# Patient Record
Sex: Female | Born: 1940 | Race: White | Hispanic: No | Marital: Married | Smoking: Never smoker
Health system: Southern US, Community
[De-identification: ages and names within clinical notes are randomized; demographics above are authoritative.]

## PROBLEM LIST (undated history)

## (undated) DIAGNOSIS — E7800 Pure hypercholesterolemia, unspecified: Secondary | ICD-10-CM

## (undated) DIAGNOSIS — E119 Type 2 diabetes mellitus without complications: Secondary | ICD-10-CM

## (undated) DIAGNOSIS — J449 Chronic obstructive pulmonary disease, unspecified: Secondary | ICD-10-CM

## (undated) DIAGNOSIS — I4891 Unspecified atrial fibrillation: Secondary | ICD-10-CM

## (undated) DIAGNOSIS — N289 Disorder of kidney and ureter, unspecified: Secondary | ICD-10-CM

## (undated) DIAGNOSIS — M1990 Unspecified osteoarthritis, unspecified site: Secondary | ICD-10-CM

## (undated) HISTORY — PX: CHOLECYSTECTOMY: SHX55

## (undated) HISTORY — PX: NEPHRECTOMY: SHX65

## (undated) HISTORY — PX: ABDOMINAL HYSTERECTOMY: SHX81

## (undated) HISTORY — PX: OTHER SURGICAL HISTORY: SHX169

---

## 2006-11-04 ENCOUNTER — Emergency Department (HOSPITAL_COMMUNITY): Admission: EM | Admit: 2006-11-04 | Discharge: 2006-11-04 | Payer: Self-pay | Admitting: Emergency Medicine

## 2007-02-24 ENCOUNTER — Observation Stay (HOSPITAL_COMMUNITY): Admission: EM | Admit: 2007-02-24 | Discharge: 2007-02-25 | Payer: Self-pay | Admitting: Emergency Medicine

## 2007-03-06 ENCOUNTER — Encounter (HOSPITAL_COMMUNITY): Admission: RE | Admit: 2007-03-06 | Discharge: 2007-05-15 | Payer: Self-pay | Admitting: Internal Medicine

## 2007-06-24 ENCOUNTER — Emergency Department (HOSPITAL_COMMUNITY): Admission: EM | Admit: 2007-06-24 | Discharge: 2007-06-24 | Payer: Self-pay | Admitting: Emergency Medicine

## 2008-10-23 IMAGING — CR DG CHEST 2V
2 series · 2 of 2 positions shown · non-contrast
Comparison: None.

CLINICAL DATA: Shortness of breath, fever, chest pain.
 CHEST - 2 VIEW - 11/04/06:

[w chest pa]
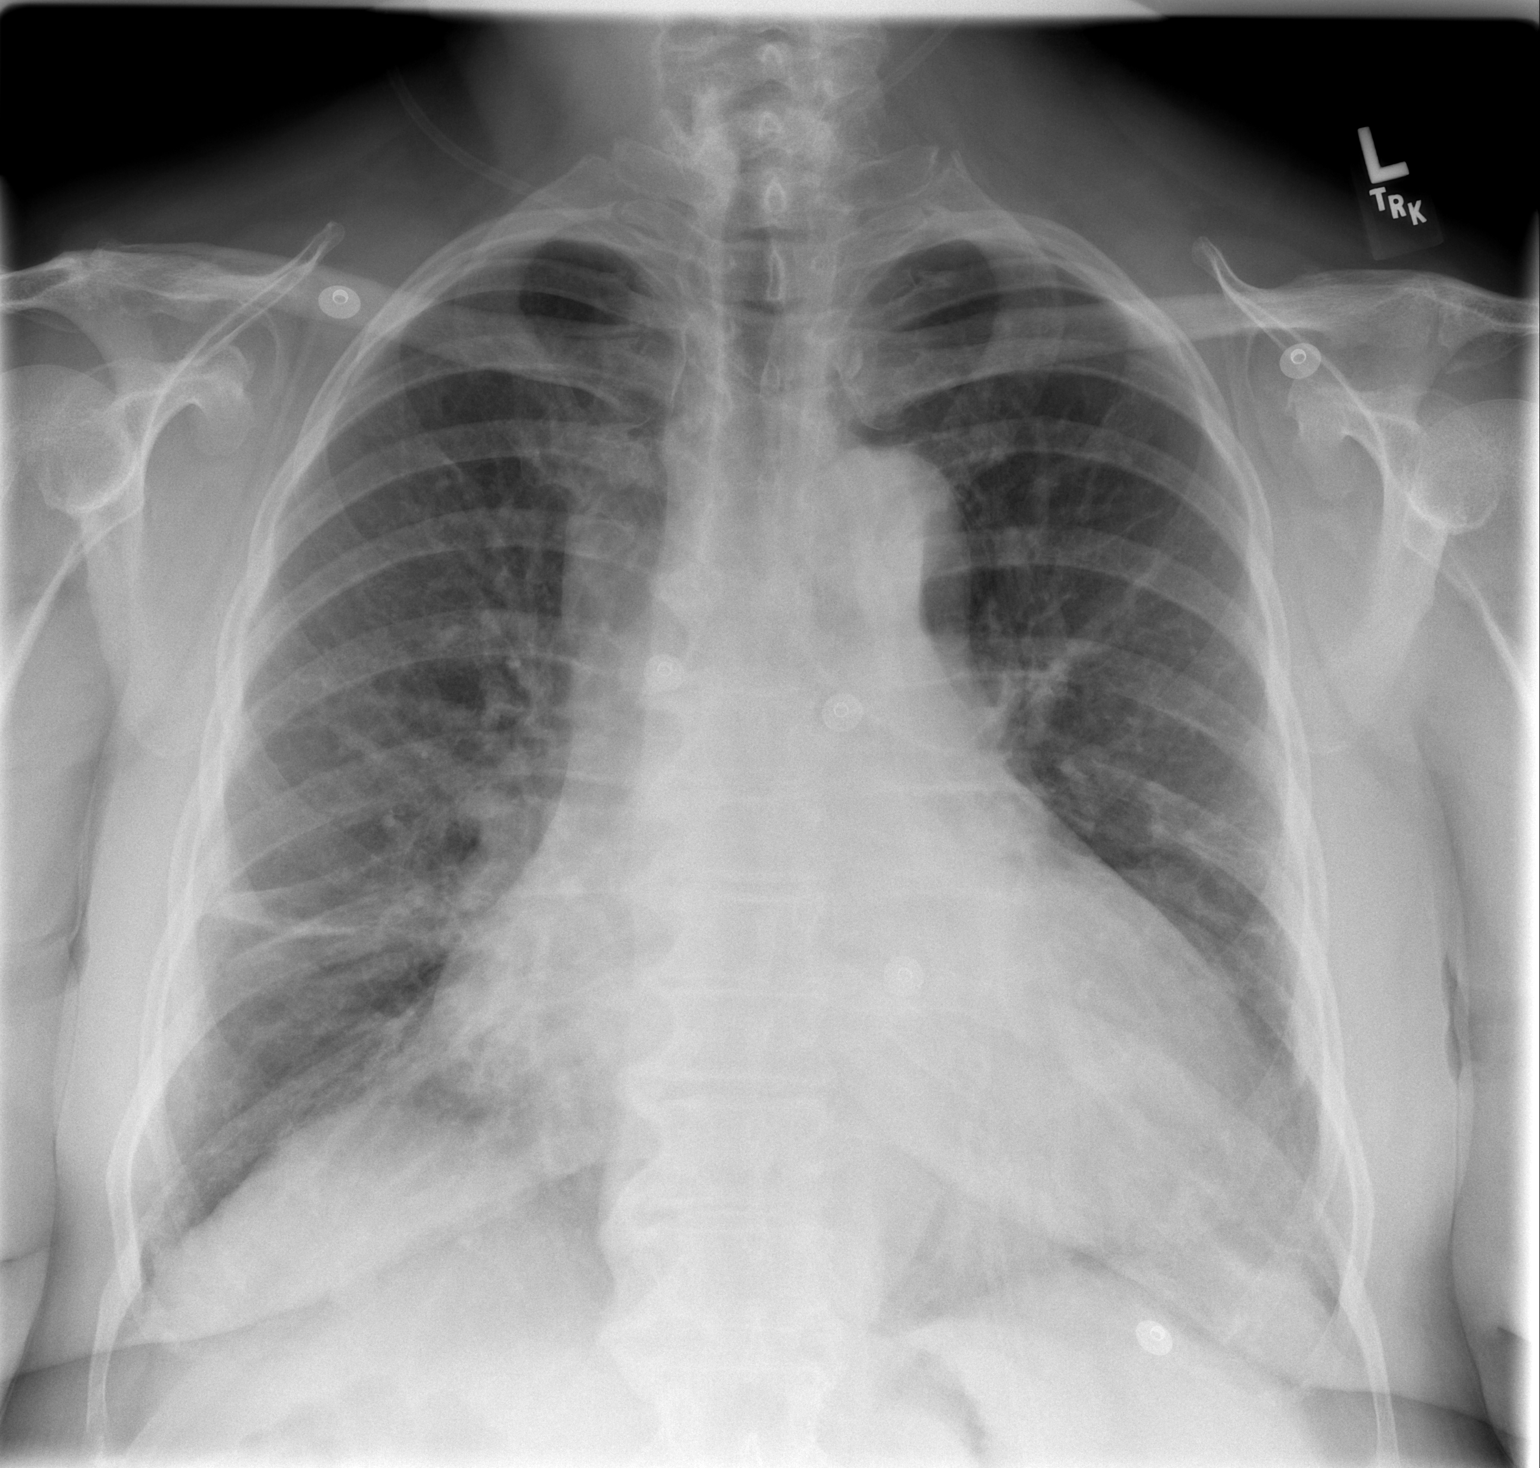

[w chest lat]
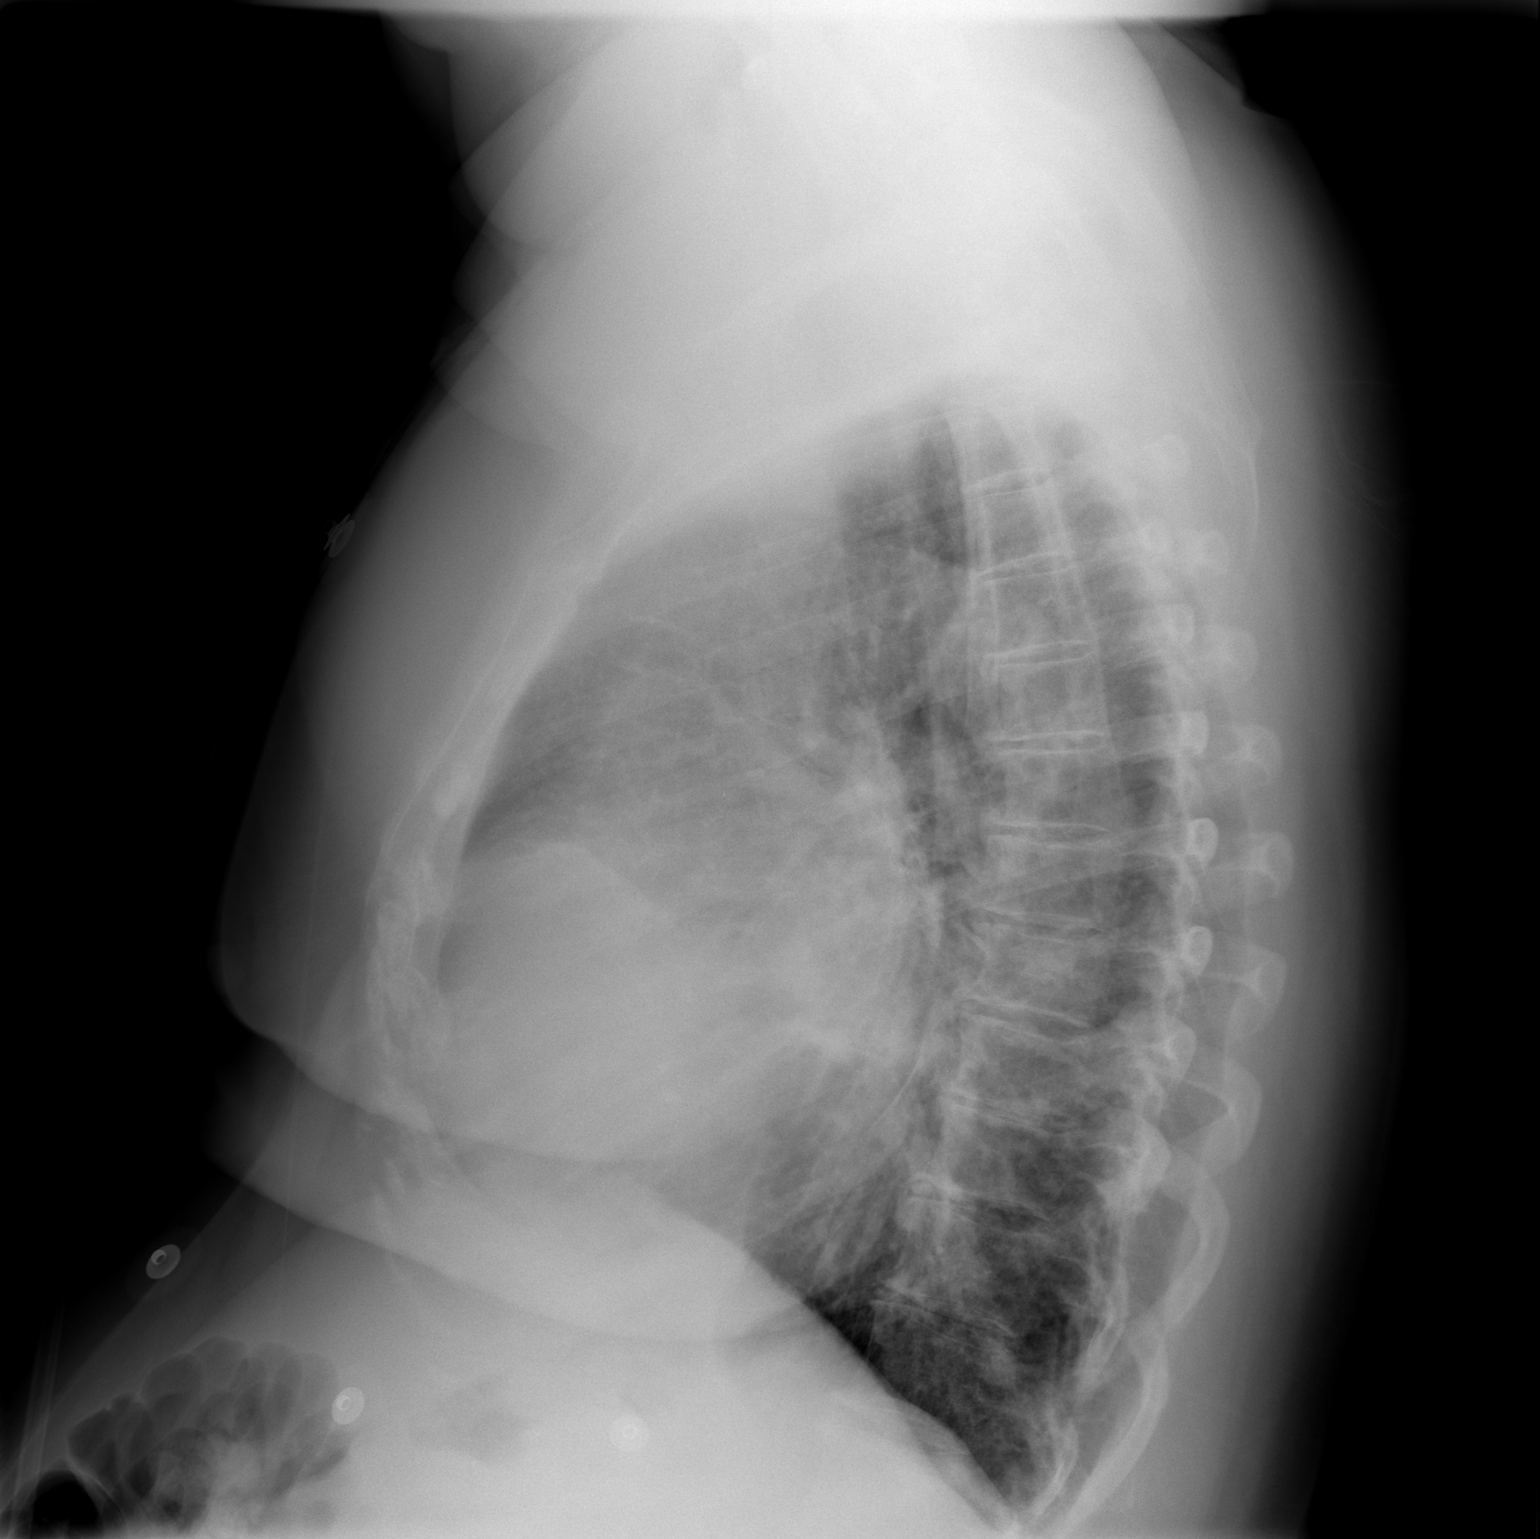

[2 of 2 positions shown; findings below may reference images not displayed]

FINDINGS: The trachea is midline.   The heart size is enlarged.  Scattered subsegmental atelectasis.  No pleural fluid.
IMPRESSION: Scattered subsegmental atelectasis.

## 2008-12-07 ENCOUNTER — Encounter: Payer: Self-pay | Admitting: Cardiology

## 2008-12-07 ENCOUNTER — Ambulatory Visit: Payer: Self-pay | Admitting: Cardiology

## 2008-12-07 ENCOUNTER — Inpatient Hospital Stay (HOSPITAL_COMMUNITY): Admission: EM | Admit: 2008-12-07 | Discharge: 2008-12-10 | Payer: Self-pay | Admitting: Emergency Medicine

## 2009-06-12 IMAGING — CR DG SACRUM/COCCYX 2+V
3 series · 3 of 3 positions shown · non-contrast
Comparison: 02/24/2007

CLINICAL DATA: Fall with sacral pain

SACRUM AND COCCYX - 2+ VIEW

[t sacrum a.p. *]
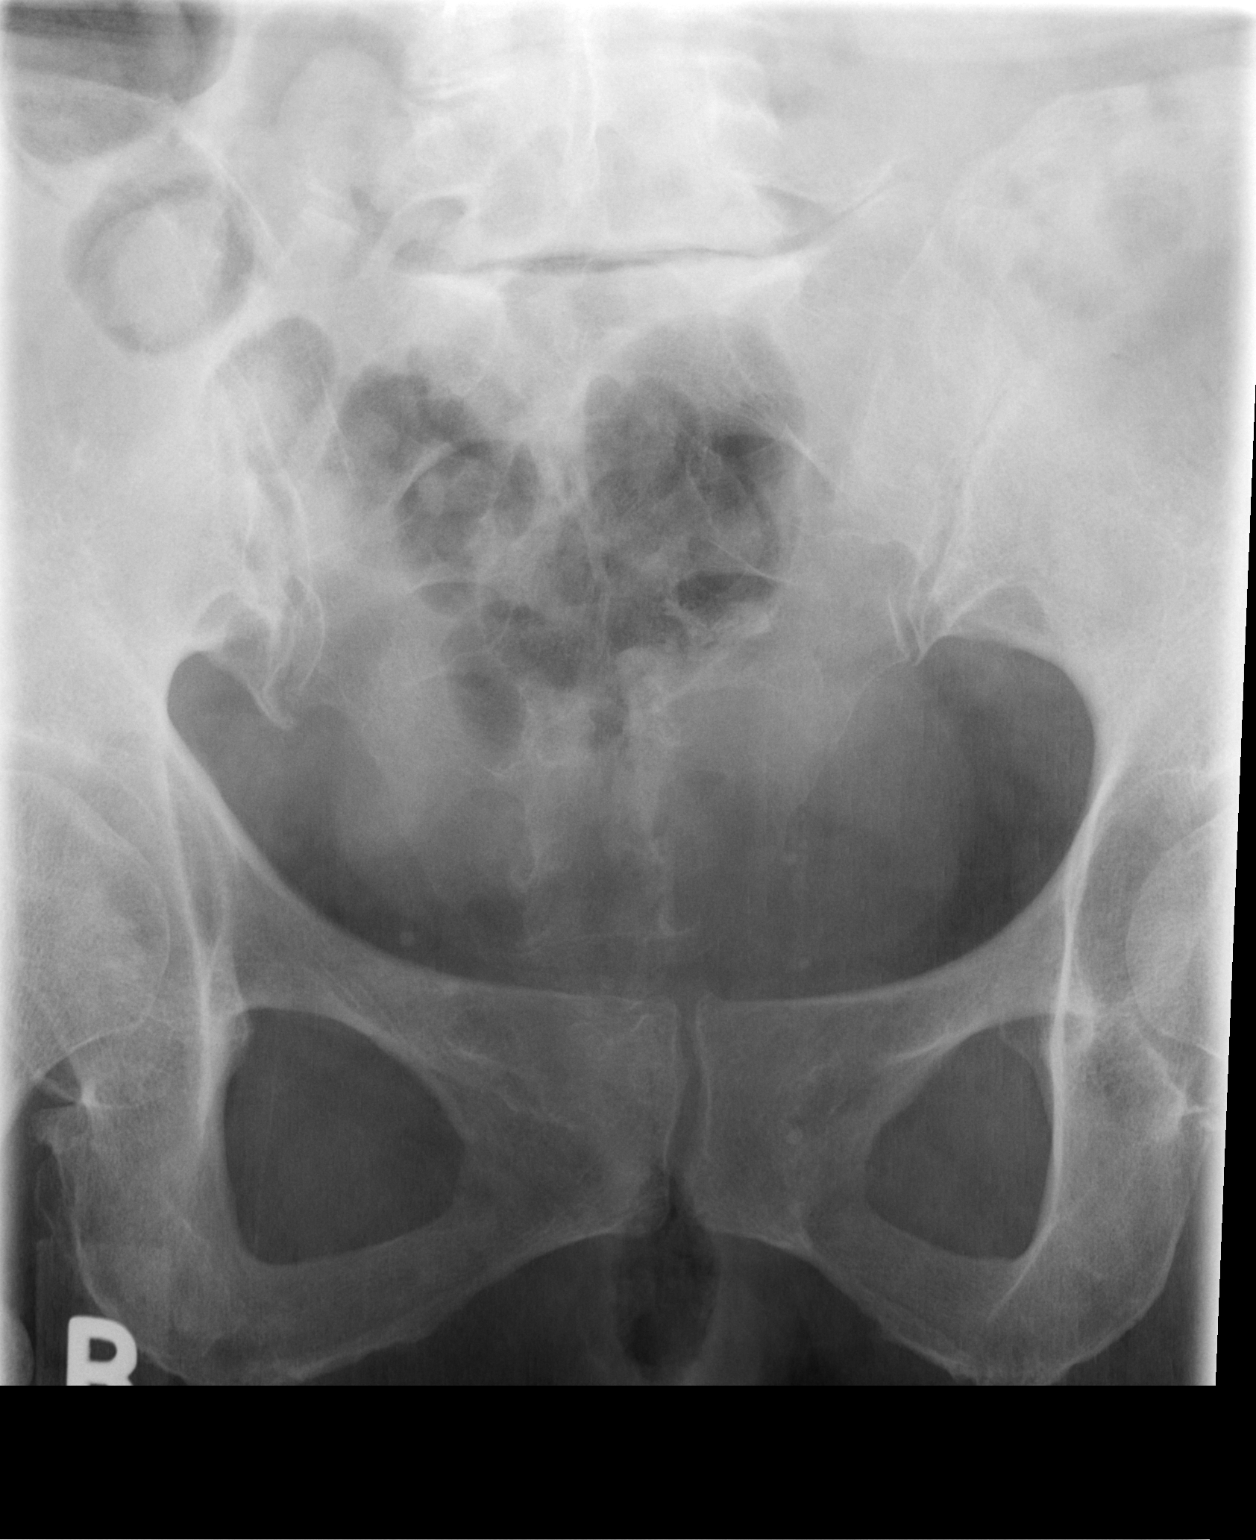

[t coccyx a.p. *]
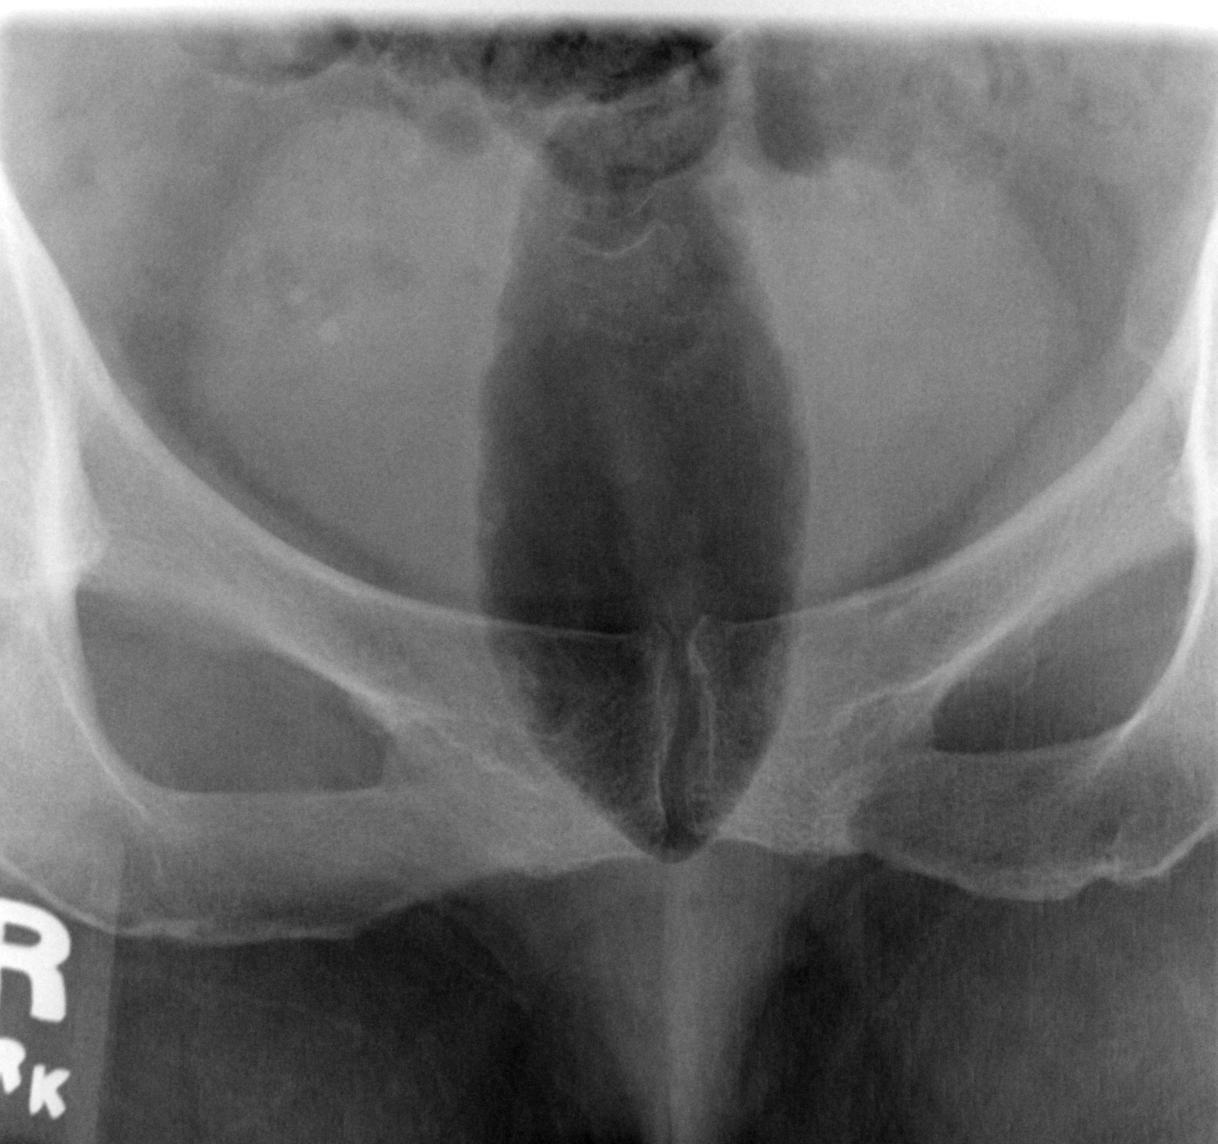

[t sacrum lat *]
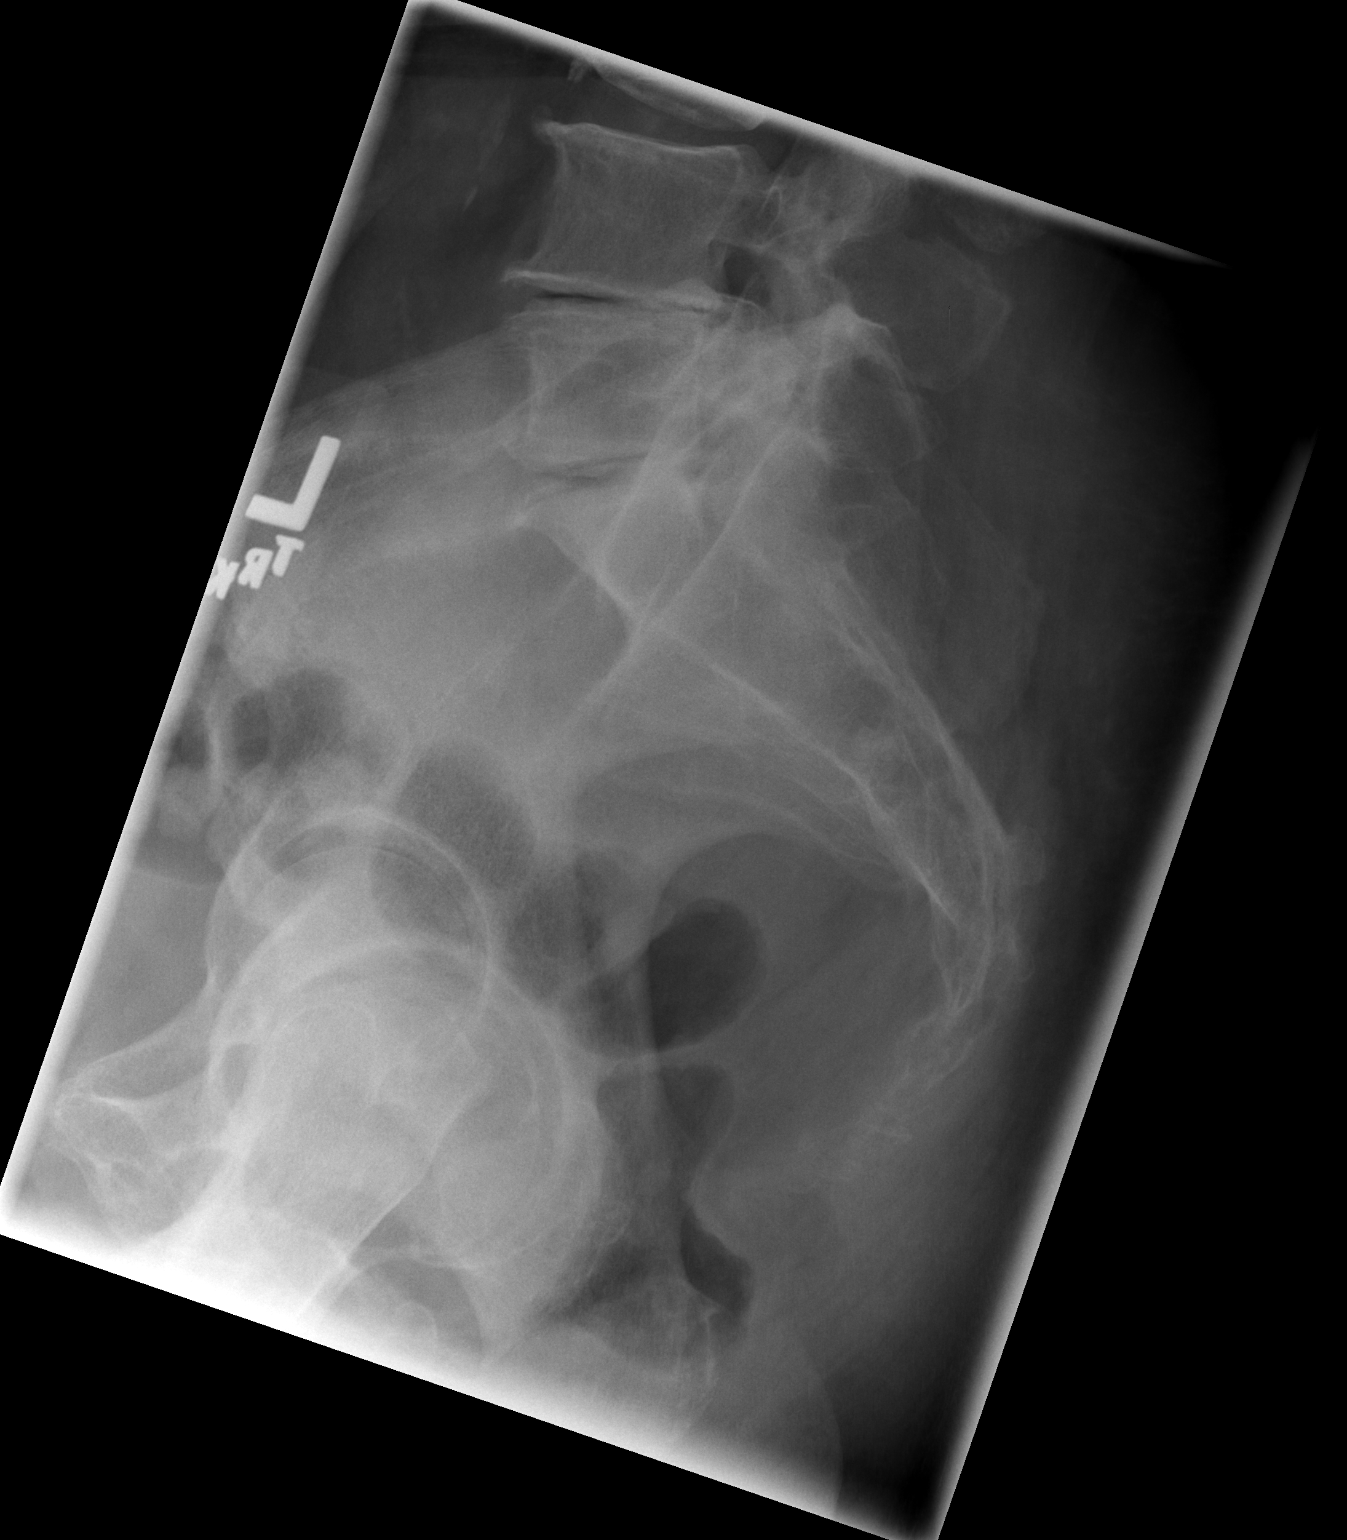

[3 of 3 positions shown; findings below may reference images not displayed]

FINDINGS: Portions of the sacrum are obscured by overlying bowel
gas and colonic contents.  No definite disruption of the arcuate
lines noted.  There is bony demineralization with poor delineation
of the cortex at the sacrococcygeal junction.  This could be a sign
of fracture.  Consider CT scan or MRI for confirmation.
IMPRESSION: 1.  Mild bony irregularity at the lumbosacral junction on the
lateral projection, possibly a manifestation of nondisplaced
fracture.  Consider CT or MRI for confirmation.

## 2009-09-04 ENCOUNTER — Emergency Department (HOSPITAL_COMMUNITY): Admission: EM | Admit: 2009-09-04 | Discharge: 2009-09-04 | Payer: Self-pay | Admitting: Family Medicine

## 2009-10-04 ENCOUNTER — Ambulatory Visit (HOSPITAL_COMMUNITY): Admission: RE | Admit: 2009-10-04 | Discharge: 2009-10-04 | Payer: Self-pay | Admitting: Urology

## 2010-05-25 LAB — PROTIME-INR
INR: 1.35 (ref 0.00–1.49)
Prothrombin Time: 16.6 seconds — ABNORMAL HIGH (ref 11.6–15.2)

## 2010-05-25 LAB — BASIC METABOLIC PANEL
BUN: 10 mg/dL (ref 6–23)
BUN: 12 mg/dL (ref 6–23)
BUN: 12 mg/dL (ref 6–23)
CO2: 26 mEq/L (ref 19–32)
CO2: 31 mEq/L (ref 19–32)
Calcium: 8.1 mg/dL — ABNORMAL LOW (ref 8.4–10.5)
Calcium: 8.2 mg/dL — ABNORMAL LOW (ref 8.4–10.5)
Calcium: 8.6 mg/dL (ref 8.4–10.5)
Chloride: 101 mEq/L (ref 96–112)
Chloride: 102 mEq/L (ref 96–112)
Chloride: 105 mEq/L (ref 96–112)
Chloride: 97 mEq/L (ref 96–112)
Creatinine, Ser: 1.4 mg/dL — ABNORMAL HIGH (ref 0.4–1.2)
Creatinine, Ser: 1.47 mg/dL — ABNORMAL HIGH (ref 0.4–1.2)
GFR calc Af Amer: 40 mL/min — ABNORMAL LOW (ref >60)
GFR calc Af Amer: 42 mL/min — ABNORMAL LOW (ref >60)
GFR calc Af Amer: 44 mL/min — ABNORMAL LOW (ref >60)
GFR calc non Af Amer: 33 mL/min — ABNORMAL LOW (ref >60)
GFR calc non Af Amer: 36 mL/min — ABNORMAL LOW (ref >60)
Glucose, Bld: 130 mg/dL — ABNORMAL HIGH (ref 70–99)
Potassium: 3 mEq/L — ABNORMAL LOW (ref 3.5–5.1)
Potassium: 3.4 mEq/L — ABNORMAL LOW (ref 3.5–5.1)
Potassium: 3.9 mEq/L (ref 3.5–5.1)
Sodium: 139 mEq/L (ref 135–145)
Sodium: 139 mEq/L (ref 135–145)
Sodium: 141 mEq/L (ref 135–145)

## 2010-05-25 LAB — HEPATIC FUNCTION PANEL
AST: 20 U/L (ref 0–37)
Alkaline Phosphatase: 47 U/L (ref 39–117)
Bilirubin, Direct: 0.2 mg/dL (ref 0.0–0.3)
Indirect Bilirubin: 0.6 mg/dL (ref 0.3–0.9)

## 2010-05-25 LAB — GLUCOSE, CAPILLARY
Glucose-Capillary: 119 mg/dL — ABNORMAL HIGH (ref 70–99)
Glucose-Capillary: 127 mg/dL — ABNORMAL HIGH (ref 70–99)
Glucose-Capillary: 131 mg/dL — ABNORMAL HIGH (ref 70–99)
Glucose-Capillary: 148 mg/dL — ABNORMAL HIGH (ref 70–99)
Glucose-Capillary: 157 mg/dL — ABNORMAL HIGH (ref 70–99)

## 2010-05-25 LAB — HEPARIN LEVEL (UNFRACTIONATED)
Heparin Unfractionated: 0.12 IU/mL — ABNORMAL LOW (ref 0.30–0.70)
Heparin Unfractionated: 0.36 IU/mL (ref 0.30–0.70)

## 2010-05-25 LAB — CBC
HCT: 29.9 % — ABNORMAL LOW (ref 36.0–46.0)
HCT: 30.1 % — ABNORMAL LOW (ref 36.0–46.0)
Hemoglobin: 10.2 g/dL — ABNORMAL LOW (ref 12.0–15.0)
Hemoglobin: 11 g/dL — ABNORMAL LOW (ref 12.0–15.0)
MCHC: 34 g/dL (ref 30.0–36.0)
MCHC: 34.2 g/dL (ref 30.0–36.0)
MCV: 89.3 fL (ref 78.0–100.0)
MCV: 90.1 fL (ref 78.0–100.0)
Platelets: 168 10*3/uL (ref 150–400)
RBC: 3.34 MIL/uL — ABNORMAL LOW (ref 3.87–5.11)
RBC: 3.65 MIL/uL — ABNORMAL LOW (ref 3.87–5.11)
RDW: 15.9 % — ABNORMAL HIGH (ref 11.5–15.5)
WBC: 7.5 10*3/uL (ref 4.0–10.5)
WBC: 8.4 10*3/uL (ref 4.0–10.5)

## 2010-05-25 LAB — CARDIAC PANEL(CRET KIN+CKTOT+MB+TROPI)
CK, MB: 0.9 ng/mL (ref 0.3–4.0)
CK, MB: 1.4 ng/mL (ref 0.3–4.0)
Total CK: 81 U/L (ref 7–177)

## 2010-05-25 LAB — DIFFERENTIAL
Basophils Relative: 0 % (ref 0–1)
Eosinophils Absolute: 0 10*3/uL (ref 0.0–0.7)
Eosinophils Relative: 0 % (ref 0–5)
Neutro Abs: 4.5 10*3/uL (ref 1.7–7.7)
Neutrophils Relative %: 73 % (ref 43–77)

## 2010-05-25 LAB — CK TOTAL AND CKMB (NOT AT ARMC)
Relative Index: INVALID (ref 0.0–2.5)
Total CK: 79 U/L (ref 7–177)

## 2010-05-25 LAB — LIPID PANEL
HDL: 43 mg/dL (ref >39)
LDL Cholesterol: 67 mg/dL (ref 0–99)
Total CHOL/HDL Ratio: 2.9 RATIO
Triglycerides: 67 mg/dL (ref <150)
VLDL: 13 mg/dL (ref 0–40)

## 2010-05-25 LAB — BRAIN NATRIURETIC PEPTIDE: Pro B Natriuretic peptide (BNP): 67 pg/mL (ref 0.0–100.0)

## 2010-05-25 LAB — POCT CARDIAC MARKERS: CKMB, poc: <1 ng/mL — ABNORMAL LOW (ref 1.0–8.0)

## 2010-07-04 NOTE — H&P (Signed)
Wanda Harrison, Wanda Harrison                 ACCOUNT NO.:  [id number]   MEDICAL RECORD NO.:  [id number]          PATIENT TYPE:  INP   LOCATION:  1830                         FACILITY:  [facility]   PHYSICIAN:  Hollice Espy, M.D.DATE OF BIRTH:  1940/05/23   DATE OF ADMISSION:  02/24/2007  DATE OF DISCHARGE:                              HISTORY & PHYSICAL   PRIMARY CARE PHYSICIAN:  Deirdre Peer. Polite, M.D.   CHIEF COMPLAINT:  Nausea, vomiting, abdominal pain, and mild shortness  of breath.   HISTORY OF PRESENT ILLNESS:  The patient is a 70 year old white female  with past medical history of CAD, CHF, diabetes mellitus, COPD who for  the past several days is having progressive worsening nausea, vomiting,  and abdominal pain, generalized to the point where she is unable to keep  any food or drink down.  It got to the point where she kept trying to  take her medicines and kept throwing them back up.  When she came in,  she could not __________  .  She  also started to feel short of breath.  When she came to the emergency room, she had an abdominal x-ray that was  unremarkable.  She was noted just to have some mild CHF at best.  Labs  were done on the patient.  She was found to have a normal white count  with no shift.  Her BNP was only minimally elevated at 276.  She had  normal cardiac markers.  Normal BMET level.  Given her continued  inability to  be able to keep very little down, it was felt best that  she come in for further treatment and evaluation.  Apparently she has  done well. She complains of some mild nausea.  She denies any headaches,  vision changes.  No dysphagia, no chest pain or palpitations.  She  complains of some shortness of breath but no wheezing or coughing.  She  complains of some generalized abdominal pain but no hematuria, dysuria.  No diarrhea but she does have constipation.  No focal extremity  numbness, weakness or pain other than chronic lower extremity numbness.  Review of systems otherwise negative.   PAST MEDICAL HISTORY:  1. Diabetes.  2. COPD.  3. Hypertension.  4. CAD.  5. CHF.  6. Depression.   MEDICATIONS:  She is on accolade, Actos, Advair, allopurinol, aspirin,  Atrovent, Lexapro, Detrol, diltiazem, glipizide, iron, Januvia, Lasix,  lisinopril, metformin, K-Dur, Protonix, simvastatin, temazepam,  theophylline, and Ambien.   ALLERGIES:  She reports allergies to CAPOTEN and PROCARDIA which she is  on.  HCTZ.   SOCIAL HISTORY:  She denies any tobacco or alcohol or drug use.   FAMILY HISTORY:  Noncontributory.   PHYSICAL EXAMINATION:  VITAL SIGNS:  On admission, temperature 98.2,  heart rate 78, blood pressure 150/92, now down to 133/72, respirations  20, oxygen saturation 92% on 3 L.  GENERAL:  The patient is alert and oriented, in no apparent distress.  HEENT:  Normocephalic, atraumatic.  Mucous membranes are dry.  No  carotid bruits.  HEART:  Currently regular  rate and rhythm.  2/6 systolic ejection  murmur.  LUNGS:  Decreased breath sounds throughout.  She has moderate airway  exchange.  ABDOMEN:  Soft, obese, minimal tenderness with hypoactive bowel sounds.  Hypoactive bowel sounds.  EXTREMITIES:  No clubbing or cyanosis, but a 1+ pitting edema. She has  signs of chronic sensory loss from diabetes mellitus.   Her EKG shows atrial fibrillation with incomplete right bundle branch  block.  Compared to an EKG from November 04, 2006, she has atrial  fibrillation.  It is unclear whether this is new or old.  Currently, she  is in a normal sinus rhythm so this may be paroxysmal atrial  fibrillation.  There are no previous records that document this.   LABORATORY DATA:  Chest x-ray is as per HPI.  White count 6.5,  hemoglobin 11.4, hematocrit 34, MCV 83, platelet count 255, no shift.  Sodium 140, potassium 3.6, chloride 99, bicarb 31, BUN 7, creatinine  0.09, glucose 72. LFTs unremarkable.  Lipase level 17.  CPK 71.5, MB  less  than 1, troponin-I less than 0.05.  BNP 276.   ASSESSMENT/PLAN:  1. Nausea and vomiting, abdominal pain.  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  Will hold her Januvia and Lantus and treat with IV      Reglan.  2. Shortness of breath, mild congestive heart failure secondary to      poor absorption of medications, nausea and vomiting.  Will try the      Lasix.  Continue other medications.  3. Hypertension and coronary artery disease.  Continue lisinopril,      aspirin and other medications.  4. Chronic obstructive pulmonary disease.  Continue oxygen and      breathing treatments.  5. Diabetes mellitus.  Continue glipizide and sliding scale only.  6. Atrial fibrillation, unclear if this is new or old.  Will notify      primary care physician.  In the meantime, cover with Lovenox.      Hollice Espy, M.D.  Electronically Signed     SKK/MEDQ  D:  02/25/2007  T:  02/25/2007  Job:  [id number]   cc:   Deirdre Peer. Polite, M.D.

## 2010-11-10 LAB — POCT CARDIAC MARKERS
CKMB, poc: <1 ng/mL — ABNORMAL LOW (ref 1.0–8.0)
Myoglobin, poc: 75.4 ng/mL (ref 12–200)
Troponin i, poc: <0.05 ng/mL (ref 0.00–0.05)

## 2010-11-10 LAB — COMPREHENSIVE METABOLIC PANEL
Albumin: 3.6 g/dL (ref 3.5–5.2)
Alkaline Phosphatase: 71 U/L (ref 39–117)
BUN: 7 mg/dL (ref 6–23)
CO2: 31 mEq/L (ref 19–32)
Chloride: 99 mEq/L (ref 96–112)
GFR calc non Af Amer: >60 mL/min (ref >60)
Glucose, Bld: 82 mg/dL (ref 70–99)
Potassium: 3.6 mEq/L (ref 3.5–5.1)
Total Bilirubin: 0.7 mg/dL (ref 0.3–1.2)

## 2010-11-10 LAB — CBC
HCT: 34.5 % — ABNORMAL LOW (ref 36.0–46.0)
Hemoglobin: 11.4 g/dL — ABNORMAL LOW (ref 12.0–15.0)
MCHC: 33 g/dL (ref 30.0–36.0)
MCV: 82.3 fL (ref 78.0–100.0)
Platelets: 255 10*3/uL (ref 150–400)
RBC: 4.19 MIL/uL (ref 3.87–5.11)
RDW: 16.4 % — ABNORMAL HIGH (ref 11.5–15.5)
WBC: 6.5 10*3/uL (ref 4.0–10.5)

## 2010-11-10 LAB — DIFFERENTIAL
Basophils Absolute: 0 10*3/uL (ref 0.0–0.1)
Basophils Relative: 0 % (ref 0–1)
Monocytes Absolute: 0.8 10*3/uL (ref 0.1–1.0)
Neutro Abs: 4 10*3/uL (ref 1.7–7.7)

## 2010-11-10 LAB — HEMOGLOBIN A1C: Mean Plasma Glucose: 136 mg/dL

## 2010-11-10 LAB — LIPASE, BLOOD: Lipase: 17 U/L (ref 11–59)

## 2010-11-10 LAB — B-NATRIURETIC PEPTIDE (CONVERTED LAB): Pro B Natriuretic peptide (BNP): 276 pg/mL — ABNORMAL HIGH (ref 0.0–100.0)

## 2010-11-30 LAB — DIFFERENTIAL
Basophils Absolute: 0
Basophils Relative: 0
Neutro Abs: 8.6 — ABNORMAL HIGH
Neutrophils Relative %: 78 — ABNORMAL HIGH

## 2010-11-30 LAB — CBC
MCHC: 33.1
RBC: 4.82
RDW: 19.9 — ABNORMAL HIGH

## 2010-11-30 LAB — I-STAT 8, (EC8 V) (CONVERTED LAB)
Chloride: 101
Glucose, Bld: 94
Potassium: 3.7
pH, Ven: 7.454 — ABNORMAL HIGH

## 2010-11-30 LAB — POCT CARDIAC MARKERS
Myoglobin, poc: 127
Operator id: 146091

## 2012-03-31 ENCOUNTER — Encounter (INDEPENDENT_AMBULATORY_CARE_PROVIDER_SITE_OTHER): Payer: Medicare Other | Admitting: Ophthalmology

## 2012-03-31 DIAGNOSIS — H3530 Unspecified macular degeneration: Secondary | ICD-10-CM

## 2012-03-31 DIAGNOSIS — E1139 Type 2 diabetes mellitus with other diabetic ophthalmic complication: Secondary | ICD-10-CM

## 2012-03-31 DIAGNOSIS — H35059 Retinal neovascularization, unspecified, unspecified eye: Secondary | ICD-10-CM

## 2012-03-31 DIAGNOSIS — I10 Essential (primary) hypertension: Secondary | ICD-10-CM

## 2012-03-31 DIAGNOSIS — H43819 Vitreous degeneration, unspecified eye: Secondary | ICD-10-CM

## 2012-03-31 DIAGNOSIS — E11319 Type 2 diabetes mellitus with unspecified diabetic retinopathy without macular edema: Secondary | ICD-10-CM

## 2012-03-31 DIAGNOSIS — H35039 Hypertensive retinopathy, unspecified eye: Secondary | ICD-10-CM

## 2012-04-08 ENCOUNTER — Encounter (INDEPENDENT_AMBULATORY_CARE_PROVIDER_SITE_OTHER): Payer: Self-pay | Admitting: Ophthalmology

## 2012-04-28 ENCOUNTER — Ambulatory Visit (INDEPENDENT_AMBULATORY_CARE_PROVIDER_SITE_OTHER): Payer: Medicare Other | Admitting: Ophthalmology

## 2012-04-28 DIAGNOSIS — H35059 Retinal neovascularization, unspecified, unspecified eye: Secondary | ICD-10-CM

## 2012-06-10 ENCOUNTER — Encounter (INDEPENDENT_AMBULATORY_CARE_PROVIDER_SITE_OTHER): Payer: PRIVATE HEALTH INSURANCE | Admitting: Ophthalmology

## 2012-06-10 DIAGNOSIS — H35059 Retinal neovascularization, unspecified, unspecified eye: Secondary | ICD-10-CM

## 2012-09-09 ENCOUNTER — Encounter (INDEPENDENT_AMBULATORY_CARE_PROVIDER_SITE_OTHER): Payer: PRIVATE HEALTH INSURANCE | Admitting: Ophthalmology

## 2014-10-18 ENCOUNTER — Emergency Department (HOSPITAL_COMMUNITY)
Admission: EM | Admit: 2014-10-18 | Discharge: 2014-10-19 | Disposition: A | Discharge status: Home / Self Care | Payer: Medicare Other | Attending: Emergency Medicine | Admitting: Emergency Medicine

## 2014-10-18 ENCOUNTER — Encounter (HOSPITAL_COMMUNITY): Payer: Self-pay

## 2014-10-18 ENCOUNTER — Emergency Department (HOSPITAL_COMMUNITY): Payer: Medicare Other

## 2014-10-18 DIAGNOSIS — I4891 Unspecified atrial fibrillation: Secondary | ICD-10-CM | POA: Insufficient documentation

## 2014-10-18 DIAGNOSIS — J039 Acute tonsillitis, unspecified: Secondary | ICD-10-CM | POA: Insufficient documentation

## 2014-10-18 DIAGNOSIS — E780 Pure hypercholesterolemia: Secondary | ICD-10-CM | POA: Diagnosis not present

## 2014-10-18 DIAGNOSIS — M542 Cervicalgia: Secondary | ICD-10-CM | POA: Insufficient documentation

## 2014-10-18 DIAGNOSIS — Z87448 Personal history of other diseases of urinary system: Secondary | ICD-10-CM | POA: Insufficient documentation

## 2014-10-18 DIAGNOSIS — J029 Acute pharyngitis, unspecified: Secondary | ICD-10-CM

## 2014-10-18 DIAGNOSIS — M1990 Unspecified osteoarthritis, unspecified site: Secondary | ICD-10-CM | POA: Insufficient documentation

## 2014-10-18 DIAGNOSIS — Z7982 Long term (current) use of aspirin: Secondary | ICD-10-CM | POA: Diagnosis not present

## 2014-10-18 DIAGNOSIS — Z794 Long term (current) use of insulin: Secondary | ICD-10-CM | POA: Diagnosis not present

## 2014-10-18 DIAGNOSIS — E119 Type 2 diabetes mellitus without complications: Secondary | ICD-10-CM | POA: Insufficient documentation

## 2014-10-18 DIAGNOSIS — Z79899 Other long term (current) drug therapy: Secondary | ICD-10-CM | POA: Diagnosis not present

## 2014-10-18 DIAGNOSIS — J449 Chronic obstructive pulmonary disease, unspecified: Secondary | ICD-10-CM | POA: Diagnosis not present

## 2014-10-18 HISTORY — DX: Pure hypercholesterolemia, unspecified: E78.00

## 2014-10-18 HISTORY — DX: Chronic obstructive pulmonary disease, unspecified: J44.9

## 2014-10-18 HISTORY — DX: Unspecified atrial fibrillation: I48.91

## 2014-10-18 HISTORY — DX: Type 2 diabetes mellitus without complications: E11.9

## 2014-10-18 HISTORY — DX: Unspecified osteoarthritis, unspecified site: M19.90

## 2014-10-18 HISTORY — DX: Disorder of kidney and ureter, unspecified: N28.9

## 2014-10-18 LAB — BASIC METABOLIC PANEL
Anion gap: 9 (ref 5–15)
BUN: 26 mg/dL — AB (ref 6–20)
CHLORIDE: 95 mmol/L — AB (ref 101–111)
CO2: 35 mmol/L — AB (ref 22–32)
Calcium: 8.7 mg/dL — ABNORMAL LOW (ref 8.9–10.3)
Creatinine, Ser: 1.7 mg/dL — ABNORMAL HIGH (ref 0.44–1.00)
GFR calc Af Amer: 33 mL/min — ABNORMAL LOW (ref >60)
GFR calc non Af Amer: 29 mL/min — ABNORMAL LOW (ref >60)
GLUCOSE: 100 mg/dL — AB (ref 65–99)
POTASSIUM: 3.6 mmol/L (ref 3.5–5.1)
SODIUM: 139 mmol/L (ref 135–145)

## 2014-10-18 LAB — CBC WITH DIFFERENTIAL/PLATELET
Basophils Absolute: 0 10*3/uL (ref 0.0–0.1)
Basophils Relative: 0 % (ref 0–1)
Eosinophils Absolute: 0 10*3/uL (ref 0.0–0.7)
Eosinophils Relative: 0 % (ref 0–5)
HEMATOCRIT: 34.4 % — AB (ref 36.0–46.0)
HEMOGLOBIN: 10.6 g/dL — AB (ref 12.0–15.0)
LYMPHS ABS: 1.2 10*3/uL (ref 0.7–4.0)
LYMPHS PCT: 13 % (ref 12–46)
MCH: 26.6 pg (ref 26.0–34.0)
MCHC: 30.8 g/dL (ref 30.0–36.0)
MCV: 86.4 fL (ref 78.0–100.0)
MONOS PCT: 12 % (ref 3–12)
Monocytes Absolute: 1.1 10*3/uL — ABNORMAL HIGH (ref 0.1–1.0)
NEUTROS ABS: 6.9 10*3/uL (ref 1.7–7.7)
NEUTROS PCT: 75 % (ref 43–77)
Platelets: 214 10*3/uL (ref 150–400)
RBC: 3.98 MIL/uL (ref 3.87–5.11)
RDW: 17.6 % — ABNORMAL HIGH (ref 11.5–15.5)
WBC: 9.2 10*3/uL (ref 4.0–10.5)

## 2014-10-18 MED ORDER — OXYCODONE-ACETAMINOPHEN 5-325 MG PO TABS: 1.0000 | ORAL_TABLET | ORAL | Status: AC | PRN

## 2014-10-18 MED ORDER — CLINDAMYCIN HCL 300 MG PO CAPS: 300.0000 mg | ORAL_CAPSULE | Freq: Three times a day (TID) | ORAL | Status: AC

## 2014-10-18 MED ORDER — CLINDAMYCIN PHOSPHATE 900 MG/50ML IV SOLN
900.0000 mg | Freq: Once | INTRAVENOUS | Status: AC
  Administered 2014-10-18: 900 mg via INTRAVENOUS
  Filled 2014-10-18: qty 50

## 2014-10-18 MED ORDER — IOHEXOL 300 MG/ML  SOLN: 100.0000 mL | Freq: Once | INTRAMUSCULAR | Status: DC | PRN

## 2014-10-18 MED ORDER — DEXAMETHASONE SODIUM PHOSPHATE 10 MG/ML IJ SOLN
10.0000 mg | Freq: Once | INTRAMUSCULAR | Status: AC
  Administered 2014-10-18: 10 mg via INTRAVENOUS
  Filled 2014-10-18: qty 1

## 2014-10-18 MED ORDER — ONDANSETRON HCL 4 MG/2ML IJ SOLN
4.0000 mg | Freq: Once | INTRAMUSCULAR | Status: AC
  Administered 2014-10-18: 4 mg via INTRAVENOUS
  Filled 2014-10-18: qty 2

## 2014-10-18 MED ORDER — SODIUM CHLORIDE 0.9 % IV BOLUS (SEPSIS)
500.0000 mL | Freq: Once | INTRAVENOUS | Status: AC
  Administered 2014-10-18: 500 mL via INTRAVENOUS

## 2014-10-18 MED ORDER — MORPHINE SULFATE (PF) 4 MG/ML IV SOLN
6.0000 mg | Freq: Once | INTRAVENOUS | Status: AC
  Administered 2014-10-18: 6 mg via INTRAVENOUS
  Filled 2014-10-18: qty 2

## 2014-10-18 MED ORDER — OXYCODONE-ACETAMINOPHEN 5-325 MG PO TABS
1.0000 | ORAL_TABLET | Freq: Once | ORAL | Status: AC
  Administered 2014-10-19: 1 via ORAL
  Filled 2014-10-18: qty 1

## 2014-10-18 NOTE — ED Notes (Signed)
Bed: WA06 Expected date:  Expected time:  Means of arrival:  Comments: Hold for triage 2 

## 2014-10-18 NOTE — ED Notes (Signed)
fPatient c/o sore throat x 3 days. Patient went to UC and was told to come to the ED to rule out peritonsillar abscess.

## 2014-10-18 NOTE — Progress Notes (Signed)
EDCM spoke to patient's daughter in law at bedside.  Patient is asleep.  Patient's daughter in law reports patient has just moved here not long ago.  Patient's daughter in law is unsure if patient has a pcp or not but she went on to say that patient see DR. Mason Jim for cardiology, patient has a kidney specialist and also a lung specialist.  Patient's daughter in law reports patient's son may be able to say if patient has a pcp.

## 2014-10-18 NOTE — ED Provider Notes (Signed)
CSN: [id number]     Arrival date & time 10/18/14  1814 History   First MD Initiated Contact with Patient 10/18/14 1857     Chief Complaint  Patient presents with  . Sore Throat      HPI Patient reports increasing sore throat over the past several days and went to an urgent care and was told to come to the emergency department for possible peritonsillar abscess.  She denies fevers and chills.  She reports mild right-sided neck pain.  She reports painful swallowing and difficulty swallowing without difficulty breathing.  The difficulty in swallowing is more related to pain.  Denies chest pain or abdominal pain.  No back pain at this time.  No other complaints.  Her pain is moderate in severity.   Past Medical History  Diagnosis Date  . COPD (chronic obstructive pulmonary disease)   . Atrial fibrillation   . Renal disorder   . Arthritis   . Diabetes mellitus without complication   . High cholesterol    Past Surgical History  Procedure Laterality Date  . [employer], [employer]    . Abdominal hysterectomy    . Cholecystectomy    . Nephrectomy     History reviewed. No pertinent family history. Social History  Substance Use Topics  . Smoking status: Never Smoker   . Smokeless tobacco: Never Used  . Alcohol Use: No   OB History    No data available     Review of Systems  All other systems reviewed and are negative.     Allergies  Captopril; Hydrochlorothiazide; Naproxen; and Nifedipine  Home Medications   Prior to Admission medications   Medication Sig Start Date End Date Taking? Authorizing Provider  acetaminophen (TYLENOL) 325 MG tablet Take 650 mg by mouth every 4 (four) hours as needed for mild pain or moderate pain.   Yes Historical Provider, MD  albuterol (ACCUNEB) 0.63 MG/3ML nebulizer solution Take 1 ampule by nebulization every 6 (six) hours as needed for wheezing.   Yes Historical Provider, MD  albuterol (PROVENTIL HFA;VENTOLIN HFA) 108 (90 BASE) MCG/ACT inhaler  Inhale 2 puffs into the lungs every 4 (four) hours as needed for wheezing or shortness of breath.   Yes Historical Provider, MD  allopurinol (ZYLOPRIM) 100 MG tablet Take 100 mg by mouth daily.   Yes Historical Provider, MD  aspirin EC 81 MG tablet Take 81 mg by mouth daily.   Yes Historical Provider, MD  bisoprolol (ZEBETA) 5 MG tablet Take 2.5 mg by mouth daily.   Yes Historical Provider, MD  calcitRIOL (ROCALTROL) 0.5 MCG capsule Take 0.5 mcg by mouth daily.   Yes Historical Provider, MD  ferrous sulfate 325 (65 FE) MG EC tablet Take 325 mg by mouth daily with breakfast. 08/30/14  Yes Historical Provider, MD  furosemide (LASIX) 40 MG tablet Take 40 mg by mouth daily.  09/13/14  Yes Historical Provider, MD  insulin aspart protamine - aspart (NOVOLOG 70/30 MIX) (70-30) 100 UNIT/ML FlexPen Inject into the skin 3 (three) times daily as needed. Sliding scale. 09/13/14  Yes Historical Provider, MD  isosorbide mononitrate (IMDUR) 60 MG 24 hr tablet Take 30 mg by mouth 2 (two) times daily.   Yes Historical Provider, MD  levothyroxine (SYNTHROID, LEVOTHROID) 112 MCG tablet Take 112 mcg by mouth daily before breakfast.   Yes Historical Provider, MD  lidocaine (LIDODERM) 5 % Place 1 patch onto the skin every 12 (twelve) hours.   Yes Historical Provider, MD  Linaclotide Karlene Einstein) 145 MCG  CAPS capsule Take 145 mcg by mouth daily as needed.   Yes Historical Provider, MD  magnesium oxide (MAG-OX) 400 MG tablet Take 400 mg by mouth 2 (two) times daily.   Yes Historical Provider, MD  meclizine (ANTIVERT) 25 MG tablet Take 25 mg by mouth 2 (two) times daily.   Yes Historical Provider, MD  Multiple Vitamin (MULTIVITAMIN WITH MINERALS) TABS tablet Take 1 tablet by mouth daily.   Yes Historical Provider, MD  nitroGLYCERIN (NITROLINGUAL) 0.4 MG/SPRAY spray Place 1 spray under the tongue every 5 (five) minutes x 3 doses as needed for chest pain.   Yes Historical Provider, MD  pantoprazole (PROTONIX) 40 MG tablet Take 40 mg  by mouth daily.   Yes Historical Provider, MD  polyethylene glycol (MIRALAX / GLYCOLAX) packet Take 17 g by mouth daily as needed for mild constipation or moderate constipation.   Yes Historical Provider, MD  pravastatin (PRAVACHOL) 10 MG tablet Take 10 mg by mouth daily.   Yes Historical Provider, MD  Rivaroxaban (XARELTO) 15 MG TABS tablet Take 15 mg by mouth daily with supper.   Yes Historical Provider, MD  rOPINIRole (REQUIP) 2 MG tablet Take 2 mg by mouth at bedtime.    Yes Historical Provider, MD  torsemide (DEMADEX) 20 MG tablet Take 20 mg by mouth daily.   Yes Historical Provider, MD   BP 106/56 mmHg  Pulse 63  Temp(Src) 98.6 F (37 C) (Tympanic)  Resp 20  SpO2 92% Physical Exam  Constitutional: She is oriented to person, place, and time. She appears well-developed and well-nourished. No distress.  HENT:  Head: Normocephalic and atraumatic.  Right-sided tonsillar and peritonsillar swelling with mild deviation of the uvula.  No obvious fluctuance.  No drainage.  Tolerating secretions.  Oral airway patent.  Poor dentition at baseline but no focal tenderness.  Eyes: EOM are normal.  Neck: Normal range of motion.  Cardiovascular: Normal rate, regular rhythm and normal heart sounds.   Pulmonary/Chest: Effort normal and breath sounds normal.  Abdominal: Soft. She exhibits no distension. There is no tenderness.  Musculoskeletal: Normal range of motion.  Neurological: She is alert and oriented to person, place, and time.  Skin: Skin is warm and dry.  Psychiatric: She has a normal mood and affect. Judgment normal.  Nursing note and vitals reviewed.   ED Course  Procedures (including critical care time) Labs Review Labs Reviewed  CBC WITH DIFFERENTIAL/PLATELET - Abnormal; Notable for the following:    Hemoglobin 10.6 (*)    HCT 34.4 (*)    RDW 17.6 (*)    Monocytes Absolute 1.1 (*)    All other components within normal limits  BASIC METABOLIC PANEL - Abnormal; Notable for the  following:    Chloride 95 (*)    CO2 35 (*)    Glucose, Bld 100 (*)    BUN 26 (*)    Creatinine, Ser 1.70 (*)    Calcium 8.7 (*)    GFR calc non Af Amer 29 (*)    GFR calc Af Amer 33 (*)    All other components within normal limits    Imaging Review Ct Soft Tissue Neck Wo Contrast  10/18/2014   CLINICAL DATA:  Sore throat for 3 days, assess for peritonsillar abscess. History of renal disorder, diabetes.  EXAM: CT NECK WITHOUT CONTRAST  TECHNIQUE: Multidetector CT imaging of the neck was performed following the standard protocol without intravenous contrast. No iodinated contrast administered due to low GFR.  COMPARISON:  None.  FINDINGS: RIGHT palatine tonsil enlargement, homogeneous intermediate density. Slight fat stranding in the RIGHT parapharyngeal fat tissue planes. Presumed edema extends to the RIGHT glossal epiglottic old, patent vallecula and piriform sinus. Mild narrowing of the RIGHT hypopharynx due to medial course of Common carotid artery, normal variant. Mild calcific atherosclerosis. Mildly effaced oral pharynx due to edema. Normal noncontrast appearance of the larynx.  Normal thyroid gland. Normal appearance of the major salivary glands.  Stranding of the RIGHT posterior triangle fat. Prominent though not pathologically enlarged lymph nodes are likely reactive.  Included view of the lung apices are clear. Straightened cervical lordosis with moderate degenerative change of the spine. 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 Focal upper cervical spine prevertebral soft tissue swelling up to 11 mm. Multiple absent teeth. Visualized paranasal sinuses and mastoid air cells are well aerated.  IMPRESSION: Limited noncontrast CT neck.  Asymmetric fullness the RIGHT palatine tonsil likely represents tonsillitis, limited assessment for peritonsillar abscess though, this remains a possibility considering inflammatory changes of the  RIGHT neck. Mildly effaced oral pharynx. Recommend follow-up to exclude underlying mass.  Focal prevertebral soft tissue swelling at the level of the craniocervical junction which may be reactive due to tonsillar process though, can also be seen with calcific tendinitis of the longus colli muscle.   Electronically Signed   By: Awilda Metro M.D.   On: 10/18/2014 22:29   I have personally reviewed and evaluated these images and lab results as part of my medical decision-making.   EKG Interpretation None      MDM   Final diagnoses:  Tonsillitis    11:51 PM I spoke with Dr. Suszanne Conners will see the patient in follow-up in the office at 1 PM tomorrow.  Agrees with Decadron.  Recommends oral clindamycin for home.  Discharge home with antibiotics and pain medication    Azalia Bilis, MD 10/19/14 [phone]

## 2014-10-19 DIAGNOSIS — J039 Acute tonsillitis, unspecified: Secondary | ICD-10-CM | POA: Diagnosis not present

## 2014-10-21 ENCOUNTER — Ambulatory Visit (INDEPENDENT_AMBULATORY_CARE_PROVIDER_SITE_OTHER): Payer: Medicare Other | Admitting: Otolaryngology

## 2014-10-22 ENCOUNTER — Telehealth (HOSPITAL_COMMUNITY): Payer: Self-pay

## 2014-10-22 NOTE — Telephone Encounter (Signed)
Pt calling to find out who she was referred to.  Pt given contact info for Dr Kathie Rhodes. Wanda Harrison

## 2015-11-02 ENCOUNTER — Encounter (INDEPENDENT_AMBULATORY_CARE_PROVIDER_SITE_OTHER): Payer: PRIVATE HEALTH INSURANCE | Admitting: Ophthalmology

## 2015-11-17 ENCOUNTER — Encounter (INDEPENDENT_AMBULATORY_CARE_PROVIDER_SITE_OTHER): Payer: PRIVATE HEALTH INSURANCE | Admitting: Ophthalmology

## 2016-10-20 DEATH — deceased
